# Patient Record
Sex: Male | Born: 1965
Health system: Southern US, Community
[De-identification: ages and names within clinical notes are randomized; demographics above are authoritative.]

---

## 2002-05-08 ENCOUNTER — Ambulatory Visit: Admission: RE | Admit: 2002-05-08 | Discharge: 2002-05-08 | Payer: Self-pay | Admitting: Neurosurgery

## 2002-05-22 ENCOUNTER — Encounter: Payer: Self-pay | Admitting: Neurosurgery

## 2002-05-22 ENCOUNTER — Ambulatory Visit (HOSPITAL_COMMUNITY): Admission: RE | Admit: 2002-05-22 | Discharge: 2002-05-22 | Payer: Self-pay | Admitting: Neurosurgery

## 2004-08-15 ENCOUNTER — Emergency Department: Payer: Self-pay | Admitting: Emergency Medicine

## 2008-01-14 ENCOUNTER — Ambulatory Visit: Payer: Self-pay | Admitting: Family Medicine

## 2008-02-25 ENCOUNTER — Observation Stay (HOSPITAL_COMMUNITY): Admission: RE | Admit: 2008-02-25 | Discharge: 2008-02-26 | Payer: Self-pay | Admitting: Neurosurgery

## 2008-04-06 ENCOUNTER — Encounter: Admission: RE | Admit: 2008-04-06 | Discharge: 2008-04-06 | Payer: Self-pay | Admitting: Neurosurgery

## 2008-05-20 ENCOUNTER — Encounter: Admission: RE | Admit: 2008-05-20 | Discharge: 2008-05-20 | Payer: Self-pay | Admitting: Neurosurgery

## 2010-04-10 ENCOUNTER — Encounter: Payer: Self-pay | Admitting: Psychiatry

## 2010-08-02 NOTE — Op Note (Signed)
Justin Hayes, Justin Hayes               ACCOUNT NO.:  [id number]   MEDICAL RECORD NO.:  [id number]          PATIENT TYPE:  INP   LOCATION:  2899                         FACILITY:  [facility]   PHYSICIAN:  Reinaldo Meeker, M.D. DATE OF BIRTH:  November 22, 1965   DATE OF PROCEDURE:  02/25/2008  DATE OF DISCHARGE:                               OPERATIVE REPORT   PREOPERATIVE DIAGNOSIS:  Herniated disk, L4-L5 right, recurrent.   POSTOPERATIVE DIAGNOSIS:  Herniated disk, L4-L5 right, recurrent.   PROCEDURE:  Right L4-L5 decompressive laminectomy followed by right L4-  L5 microdiskectomy, followed by right L4-L5 transforaminal lumbar  interbody fusion with PEEK interbody spacer followed by L4-L5  nonsegmental instrumentation with left-sided transfacet screw and a  spinous process plating followed by L4-L5 posterolateral fusion.   SURGEON:  Reinaldo Meeker, MD   ASSISTANT:  Kathaleen Maser. Pool, MD   PROCEDURE IN DETAIL:  After being placed in prone position, the  patient's back was prepped and draped in the usual sterile fashion.  Localizing fluoroscopy was used prior to incision to identify the  appropriate level.  A previous lumbar incision was opened up and  extended in both directions and carried down to the tips of the spinous  processes.  Subperiosteal dissection was then carried out bilaterally on  the spinous process, lamina, and facet joint bilaterally and self-  retaining retractor was placed for exposure.  X-rays showed approach to  the appropriate level.  On the patient's right side, edges of the  previous laminotomy were identified, dissected free of soft tissue with  a curette.  High-speed drill was then used to thin out the bone to  enlarge the laminotomy to the point where the inferior two-thirds of the  L4 lamina, the medial three-quarters of facet joint, the superior two-  thirds of the L5 lamina had been removed.  Residual bone was removed and  saved for use later in the case and scar  tissue and ligamentum flavum  were removed and discarded.  At this time, microsection technique was  carried out to identify the L4-L5 disk which was incised with a 15 blade  and thoroughly cleaned out with pituitary rongeurs and curettes.  A few  large herniated disk fragments were identified and removed giving  excellent decompression of the L5 nerve root.  At this time, inspection  was carried out in all directions for any evidence of residual  compression and none could be identified.  Irrigation was carried out  and the disk space was then prepared for transverse lumbar interbody  fusion.  Disk space was distracted up to 10-mm size, this was felt to be  a good size for the spacer.  A variety of rotating cutters and curettes  were used to prepare the endplates for interbody fusion.  A cage filled  with autologous bone and Osteocel Plus was filled and the same mixture  was placed deep within the disk space to help with the interbody fusion.  The cage was then impacted without difficulty and followed into good  position across the midline and approximately one-half to two-thirds  deep  in the disk space.  At this time, left-sided transfacet screw was  placed.  Drill was used for starting point and then drill over guidewire  was passed in an superior and inferior direction and medial to lateral.  This was followed under AP and lateral fluoroscopy in good position and  also tested with NeuroVision EMG monitoring which showed no evidence of  breech of the pedicle.  Tapping was then carried out followed by  placement of 35-mm screw which was felt to be in good position by  fluoroscopy.  An appropriate length spinous process plate was then  chosen.  After bit of the interspinous ligament had been removed, the  plate was compressed without difficulty and disengaged from its holder.  Final fluoroscopy in AP and lateral direction looked excellent.  Large  amounts of irrigation were carried out at  this time.  High-speed drill  was used to decorticate the lamina and facet joint at L4-L5 on the left  and autologous bone and Osteocel Plus were placed with posterolateral  fusion.  At this time, inspection was carried out for any evidence of  residual compression of bleeding and none could be identified.  Wound  was then closed in multiple layers of Vicryl in the muscle fascia,  subcutaneous, subcuticular tissues, and staples were placed on the skin.  Once again, the final fluoroscopy in AP and lateral direction looked  excellent.  The patient was then extubated and taken to the recovery  room in stable condition.           ______________________________  Reinaldo Meeker, M.D.     ROK/MEDQ  D:  02/25/2008  T:  02/26/2008  Job:  [id number]

## 2010-08-05 NOTE — Op Note (Signed)
NAME:  Justin Hayes, Justin Hayes                         ACCOUNT NO.:  [id number]   MEDICAL RECORD NO.:  [id number]                   PATIENT TYPE:  OIB   LOCATION:  3011                                 FACILITY:  [facility]   PHYSICIAN:  Reinaldo Meeker, M.D.              DATE OF BIRTH:  01-Mar-1966   DATE OF PROCEDURE:  05/22/2002  DATE OF DISCHARGE:  05/22/2002                                 OPERATIVE REPORT   PREOPERATIVE DIAGNOSIS:  Herniated disk, L4-5 right.   POSTOPERATIVE DIAGNOSIS:  Herniated disk, L4-5 right.   PROCEDURES:  1. Right L4-5 intralaminar laminotomy for excision of herniated disk with     the operating microscope.  2. Microdissection, L4-5 disk and L5 nerve root.   SURGEON:  Reinaldo Meeker, M.D.   ASSISTANT:  Donalee Citrin, M.D.   DESCRIPTION OF PROCEDURE:  After being placed in a prone position, the  patient's back was prepped and draped in the usual sterile fashion.  A  localizing x-ray was taken prior to incision to identify the L4-5 level.  A  midline incision made above the spinous processes of L4 and L5.  Using Bovie  cutting current, the incision was carried down to the spinous processes.  Subperiosteal dissection was then carried out along the right-sided spinous  processes and laminae and the McCullough self-retaining retractor was placed  for exposure.  A second x-ray showed approach to the appropriate level.  Using the high-speed drill, the inferior one-third of the L4 lamina and the  medial one-third of the facet joint were removed.  The drill was then used  to remove the superior one-third of the L5 lamina.  Residual bone and  ligamentum flavum were removed in a piecemeal fashion.  The microscope was  draped, brought into the field, and used for the remainder of the case.  Using microdissection technique, the lateral aspect of the thecal sac and L5  nerve were identified.  Further coagulation was carried down to the floor of  the canal to identify the L4-5  disk, which was found to be markedly  herniated.  After coagulating on the annulus, the annulus was incised with a  15 blade.  Using pituitary rongeurs and curettes, a thorough disk space  clean-out was carried out.  At the same time great care was taken to avoid  injury to the neural elements, and this was successfully done.  At this  point inspection was carried out in all directions for any evidence of  residual compression, and none could be identified.  Large amounts of  irrigation were carried out and any bleeding controlled with bipolar  coagulation and Gelfoam.  The wound was then closed using interrupted Vicryl  on the muscle, fascia, subcutaneous, and subcuticular tissues, and staples  on the skin.  A sterile dressing was then applied and the patient was  extubated and taken to the recovery room in stable condition.  Reinaldo Meeker, M.D.    ROK/MEDQ  D:  05/22/2002  T:  05/23/2002  Job:  [id number]

## 2010-12-22 LAB — CBC
HCT: 43.9 % (ref 39.0–52.0)
MCHC: 34.1 g/dL (ref 30.0–36.0)
MCV: 88.4 fL (ref 78.0–100.0)
Platelets: 259 10*3/uL (ref 150–400)
RDW: 12.8 % (ref 11.5–15.5)
WBC: 7.1 10*3/uL (ref 4.0–10.5)

## 2010-12-22 LAB — ABO/RH: ABO/RH(D): A POS

## 2012-03-01 ENCOUNTER — Ambulatory Visit: Payer: Self-pay | Admitting: Family Medicine

## 2013-07-01 ENCOUNTER — Ambulatory Visit: Payer: Self-pay | Admitting: Podiatry

## 2013-07-04 ENCOUNTER — Ambulatory Visit: Payer: Self-pay | Admitting: Podiatry

## 2014-07-11 NOTE — Op Note (Signed)
PATIENT NAME:  Justin Hayes, Justin Hayes MR#:  [id number] DATE OF BIRTH:  06-Sep-1965  DATE OF PROCEDURE:  07/04/2013  PREOPERATIVE DIAGNOSIS: Right fifth metatarsal base Jones fracture.   POSTOPERATIVE DIAGNOSIS: Right fifth metatarsal base Jones fracture.   PROCEDURE: Open reduction with internal fixation of right fifth metatarsal Jones fracture.   SURGEON: Romy Mcgue A. Vickki Muff, DPM  ANESTHESIA: General with popliteal block.   HEMOSTASIS: Mid calf tourniquet inflated to 250 mmHg for approximately 38 minutes.   COMPLICATIONS: None.  SPECIMEN: None.   ESTIMATED BLOOD LOSS: Minimal.   OPERATIVE INDICATIONS: This is a 49 year old gentleman who sustained a Jones fracture to his fifth metatarsal base. He initially underwent conservative treatment, refractured through the area and presents today for surgical intervention. All risks, benefits, alternatives, and complications associated with the surgery were discussed with the patient and full informed consent has been given.   OPERATIVE PROCEDURE: The patient was brought into the OR and placed on the operating table in the supine position. A popliteal block had been placed prior to entering the OR suite. LMA anesthesia was then obtained. The right lower extremity was then prepped and draped in the usual sterile fashion. Attention was directed to the lateral aspect of the fifth metatarsal base where the fifth metatarsal was mapped out. A longitudinal incision was made in line with the peroneal tendons and sural nerve region. Initial incision was begun. This was taken down to the subcutaneous tissue. The peroneal tendon as well as the sural nerve were noted and retracted throughout the entire procedure. At this time, the base of the fifth metatarsal was then palpated with a blunt probe. Next, a guidewire for the 5.5 mm cannulated Ortho Helix screws were placed through the fifth metatarsal base into the shaft of the fifth metatarsal. Good alignment was noted in all 3  planes with fluoroscopy. Next, using standard technique, the 5.5 mm x 50 mm screw was placed. Initially, this was drilled across the fracture, but not completely to the full length. It was countersunk appropriately and the screw was then placed by hand. Good stability compression was noted at the fracture site with fluoroscopy. There was no gapping of the fracture at all. At this time, the wound was flushed with copious amounts of irrigation. Multiple fluoroscopy films were taken. A well compressive sterile dressing was placed after 4-0 Vicryl and 5-0 Monocryl for the subcutaneous layers and skin. A 0.25% Marcaine block was placed around the incision site and was placed in a posterior splint. He will be taken from the OR to the PACU. Prior to final movement from the OR to the PAC-U, a Physio-Stim bone stimulator was placed on the right lower leg overlying the fracture site. The patient will be discharged home with p.o. Percocet for pain as well as he already has crutches for nonweightbearing. I will see him in the outpatient clinic in 5 to 7 days. He understands to keep his foot elevated and call me if there are any questions in the interim.   ____________________________ Pete Glatter Vickki Muff, DPM jaf:sb D: 07/04/2013 08:52:15 ET T: 07/04/2013 10:26:51 ET JOB#: [id number]  cc: Larkin Ina A. Vickki Muff, DPM, <Dictator> Lat[place]ia [place] DPM ELECTRONICALLY SIGNED 07/11/2013 14:18

## 2016-05-16 DIAGNOSIS — Z0000 Encounter for general adult medical examination without abnormal findings: Secondary | ICD-10-CM | POA: Diagnosis not present

## 2016-09-11 DIAGNOSIS — L821 Other seborrheic keratosis: Secondary | ICD-10-CM | POA: Diagnosis not present

## 2016-09-25 DIAGNOSIS — M9901 Segmental and somatic dysfunction of cervical region: Secondary | ICD-10-CM | POA: Diagnosis not present

## 2016-09-25 DIAGNOSIS — M542 Cervicalgia: Secondary | ICD-10-CM | POA: Diagnosis not present

## 2016-09-25 DIAGNOSIS — M9902 Segmental and somatic dysfunction of thoracic region: Secondary | ICD-10-CM | POA: Diagnosis not present

## 2016-09-29 DIAGNOSIS — M9902 Segmental and somatic dysfunction of thoracic region: Secondary | ICD-10-CM | POA: Diagnosis not present

## 2016-09-29 DIAGNOSIS — M542 Cervicalgia: Secondary | ICD-10-CM | POA: Diagnosis not present

## 2016-09-29 DIAGNOSIS — M9901 Segmental and somatic dysfunction of cervical region: Secondary | ICD-10-CM | POA: Diagnosis not present

## 2016-10-02 DIAGNOSIS — M9901 Segmental and somatic dysfunction of cervical region: Secondary | ICD-10-CM | POA: Diagnosis not present

## 2016-10-02 DIAGNOSIS — M9902 Segmental and somatic dysfunction of thoracic region: Secondary | ICD-10-CM | POA: Diagnosis not present

## 2016-10-02 DIAGNOSIS — M542 Cervicalgia: Secondary | ICD-10-CM | POA: Diagnosis not present

## 2016-10-31 DIAGNOSIS — M5416 Radiculopathy, lumbar region: Secondary | ICD-10-CM | POA: Diagnosis not present

## 2016-10-31 DIAGNOSIS — M5412 Radiculopathy, cervical region: Secondary | ICD-10-CM | POA: Diagnosis not present

## 2016-11-14 DIAGNOSIS — G43709 Chronic migraine without aura, not intractable, without status migrainosus: Secondary | ICD-10-CM | POA: Diagnosis not present

## 2016-11-14 DIAGNOSIS — M5010 Cervical disc disorder with radiculopathy, unspecified cervical region: Secondary | ICD-10-CM | POA: Diagnosis not present

## 2016-11-15 ENCOUNTER — Other Ambulatory Visit: Payer: Self-pay | Admitting: Internal Medicine

## 2016-11-15 DIAGNOSIS — M5010 Cervical disc disorder with radiculopathy, unspecified cervical region: Secondary | ICD-10-CM

## 2016-11-27 ENCOUNTER — Ambulatory Visit
Admission: RE | Admit: 2016-11-27 | Discharge: 2016-11-27 | Disposition: A | Discharge status: Home / Self Care | Payer: Commercial Managed Care - HMO | Source: Ambulatory Visit | Attending: Internal Medicine | Admitting: Internal Medicine

## 2016-11-27 DIAGNOSIS — M5010 Cervical disc disorder with radiculopathy, unspecified cervical region: Secondary | ICD-10-CM | POA: Diagnosis not present

## 2016-11-27 DIAGNOSIS — M50323 Other cervical disc degeneration at C6-C7 level: Secondary | ICD-10-CM | POA: Diagnosis not present

## 2016-11-27 DIAGNOSIS — M50223 Other cervical disc displacement at C6-C7 level: Secondary | ICD-10-CM | POA: Diagnosis not present

## 2016-12-11 DIAGNOSIS — M5412 Radiculopathy, cervical region: Secondary | ICD-10-CM | POA: Diagnosis not present

## 2016-12-18 DIAGNOSIS — Z01818 Encounter for other preprocedural examination: Secondary | ICD-10-CM | POA: Diagnosis not present

## 2016-12-20 DIAGNOSIS — M50223 Other cervical disc displacement at C6-C7 level: Secondary | ICD-10-CM | POA: Diagnosis not present

## 2016-12-20 DIAGNOSIS — M4802 Spinal stenosis, cervical region: Secondary | ICD-10-CM | POA: Diagnosis not present

## 2016-12-20 DIAGNOSIS — M50222 Other cervical disc displacement at C5-C6 level: Secondary | ICD-10-CM | POA: Diagnosis not present

## 2016-12-20 DIAGNOSIS — M50122 Cervical disc disorder at C5-C6 level with radiculopathy: Secondary | ICD-10-CM | POA: Diagnosis not present

## 2016-12-20 DIAGNOSIS — M4722 Other spondylosis with radiculopathy, cervical region: Secondary | ICD-10-CM | POA: Diagnosis not present

## 2017-01-23 DIAGNOSIS — M542 Cervicalgia: Secondary | ICD-10-CM | POA: Diagnosis not present

## 2017-02-20 DIAGNOSIS — M542 Cervicalgia: Secondary | ICD-10-CM | POA: Diagnosis not present

## 2017-04-24 DIAGNOSIS — M542 Cervicalgia: Secondary | ICD-10-CM | POA: Diagnosis not present

## 2017-05-15 DIAGNOSIS — Z0000 Encounter for general adult medical examination without abnormal findings: Secondary | ICD-10-CM | POA: Diagnosis not present

## 2017-05-22 DIAGNOSIS — Z0000 Encounter for general adult medical examination without abnormal findings: Secondary | ICD-10-CM | POA: Diagnosis not present

## 2017-06-19 DIAGNOSIS — M542 Cervicalgia: Secondary | ICD-10-CM | POA: Diagnosis not present

## 2017-08-20 DIAGNOSIS — M542 Cervicalgia: Secondary | ICD-10-CM | POA: Diagnosis not present

## 2017-09-10 DIAGNOSIS — D225 Melanocytic nevi of trunk: Secondary | ICD-10-CM | POA: Diagnosis not present

## 2017-09-10 DIAGNOSIS — D2262 Melanocytic nevi of left upper limb, including shoulder: Secondary | ICD-10-CM | POA: Diagnosis not present

## 2017-09-10 DIAGNOSIS — D2261 Melanocytic nevi of right upper limb, including shoulder: Secondary | ICD-10-CM | POA: Diagnosis not present

## 2017-09-25 DIAGNOSIS — M542 Cervicalgia: Secondary | ICD-10-CM | POA: Diagnosis not present

## 2017-09-25 DIAGNOSIS — R030 Elevated blood-pressure reading, without diagnosis of hypertension: Secondary | ICD-10-CM | POA: Diagnosis not present

## 2017-11-27 DIAGNOSIS — M5136 Other intervertebral disc degeneration, lumbar region: Secondary | ICD-10-CM | POA: Diagnosis not present

## 2017-11-27 DIAGNOSIS — M7062 Trochanteric bursitis, left hip: Secondary | ICD-10-CM | POA: Diagnosis not present

## 2019-06-07 IMAGING — MR MR CERVICAL SPINE W/O CM
5 series · 33 of 48 positions shown · non-contrast
Comparison: None.

CLINICAL DATA: Left arm and shoulder pain. Numbness and tingling in
the fingers of the left hand.

EXAM:
MRI CERVICAL SPINE WITHOUT CONTRAST
TECHNIQUE: Multiplanar, multisequence MR imaging of the cervical spine was
performed. No intravenous contrast was administered.

[Series 3: T2 · sagittal · 3.0mm · 0.70mm/px · 6 of 15 slices shown (1 of 2)]
[im 1/15]
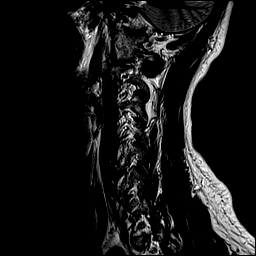
[im 3/15]
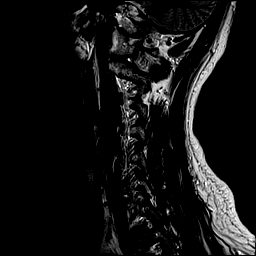
[im 6/15]
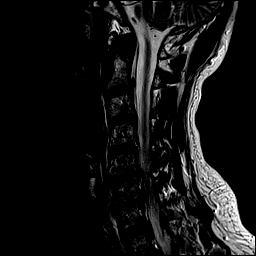
[im 9/15]
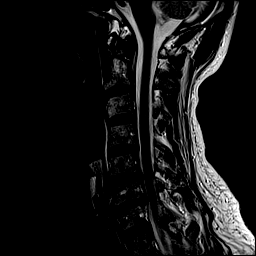
[im 12/15]
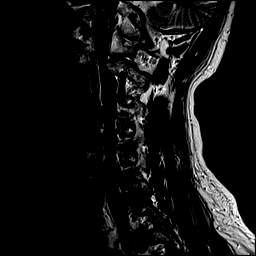
[im 15/15]
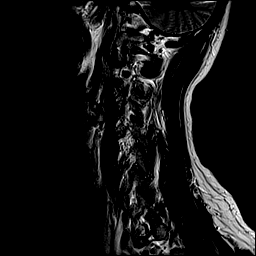

[Series 4: T1 · sagittal · 3.0mm · 0.70mm/px · 7 of 15 slices shown]
[im 1/15]
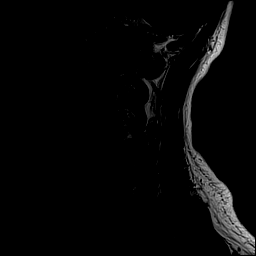
[im 3/15]
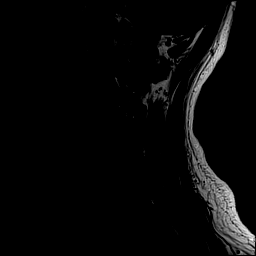
[im 5/15]
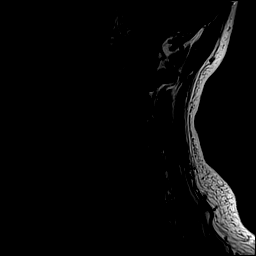
[im 8/15]
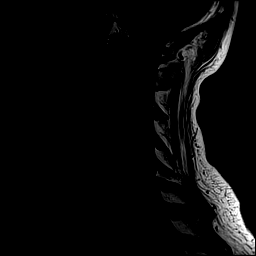
[im 10/15]
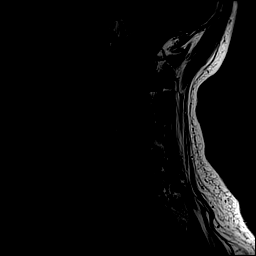
[im 12/15]
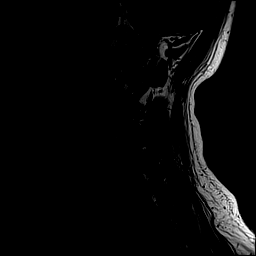
[im 15/15]
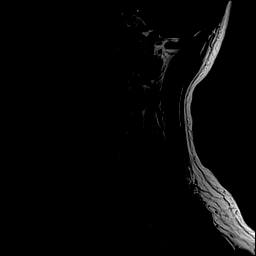

[Series 5: STIR · sagittal · 3.0mm · 0.35mm/px · 7 of 15 slices shown]
[im 1/15]
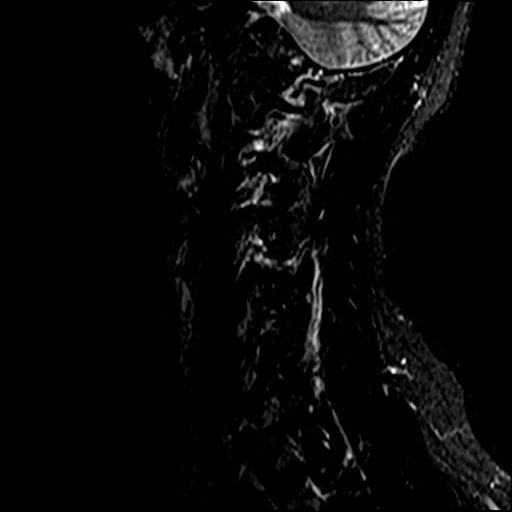
[im 3/15]
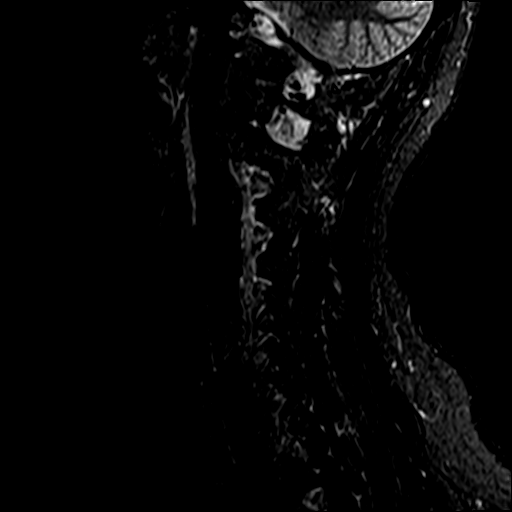
[im 5/15]
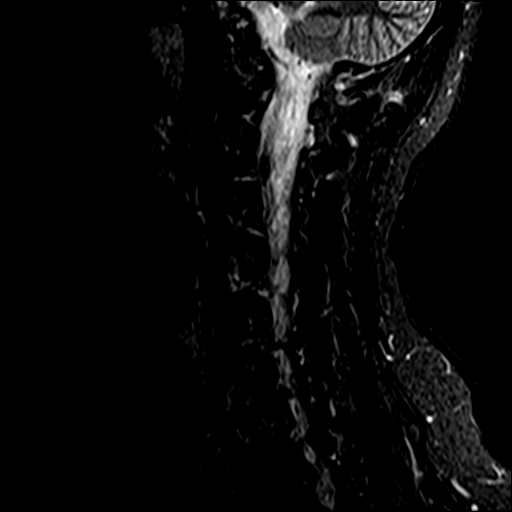
[im 8/15]
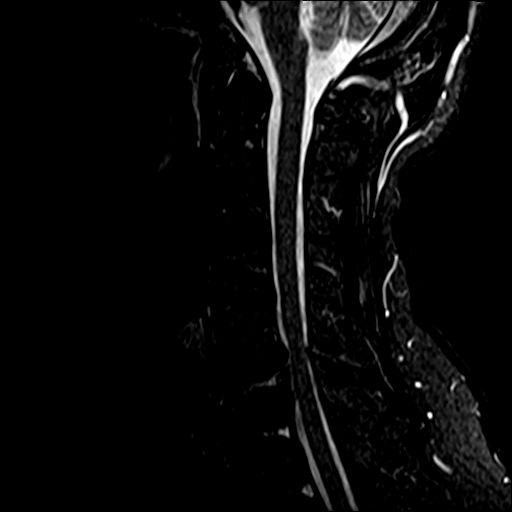
[im 10/15]
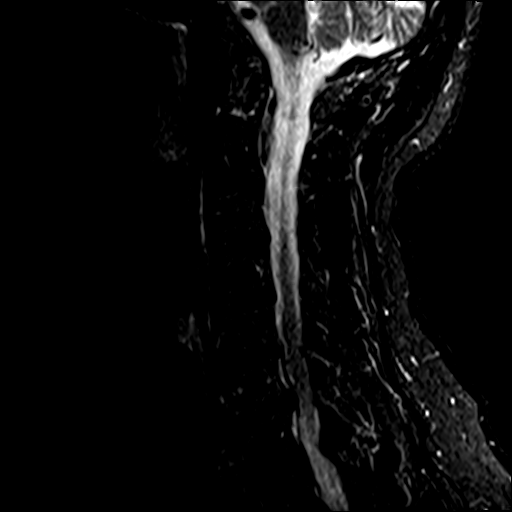
[im 12/15]
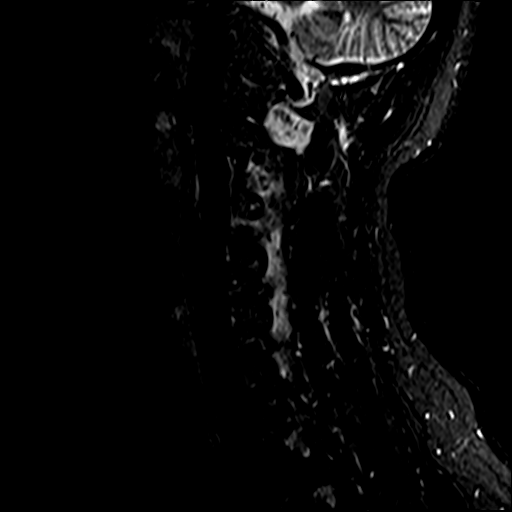
[im 15/15]
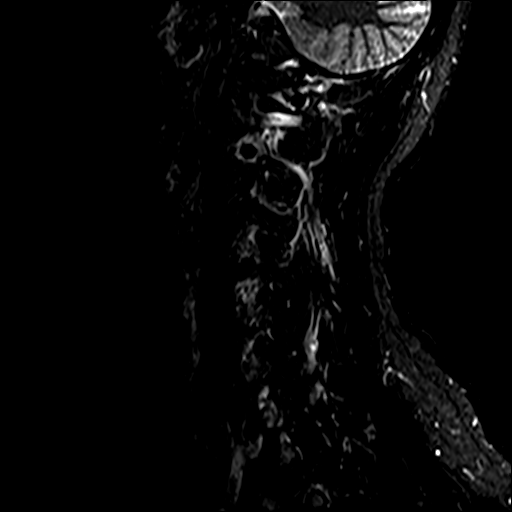

[Series 6: T2 · axial · 3.0mm · 0.70mm/px · z∈[-39,+78]mm · 8 of 31 slices shown (2 of 2)]
[im 1/31]
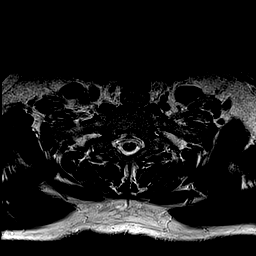
[im 5/31]
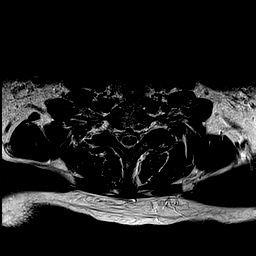
[im 10/31]
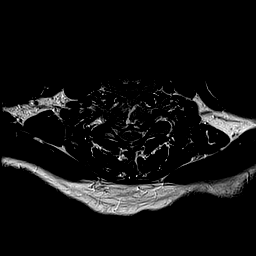
[im 14/31]
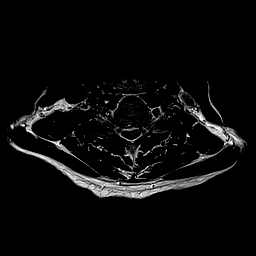
[im 17/31]
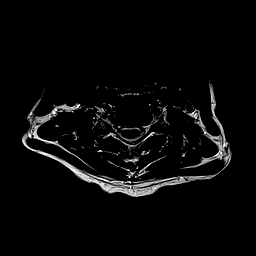
[im 21/31]
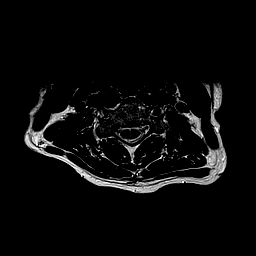
[im 26/31]
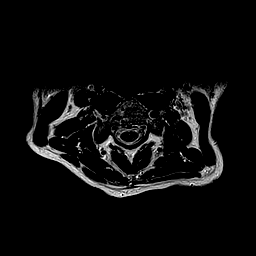
[im 31/31]
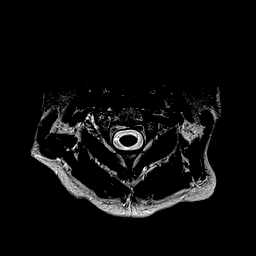

[Series 7: mpgr ax · axial · 3.0mm · 0.35mm/px · z∈[-39,+23]mm · 5 of 31 slices shown]
[im 1/31]
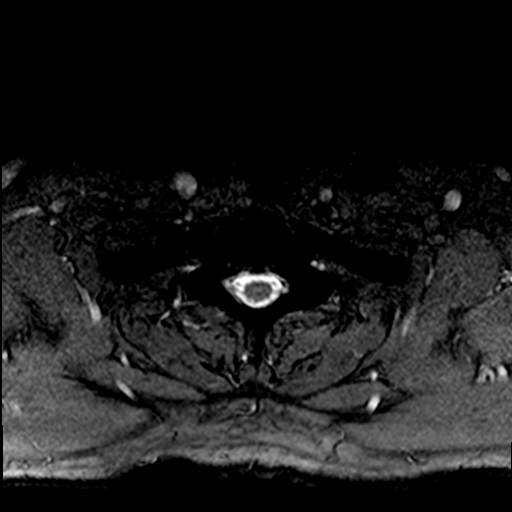
[im 5/31]
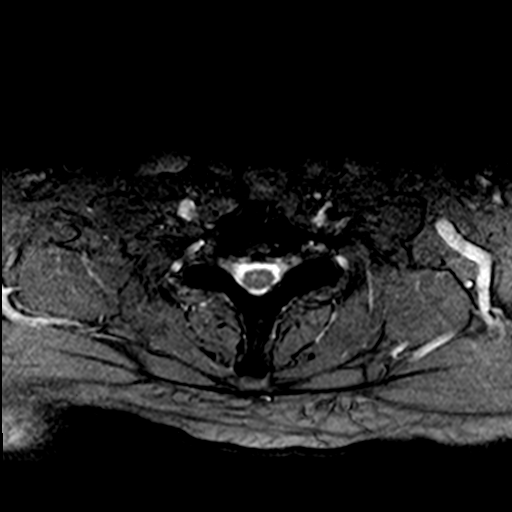
[im 10/31]
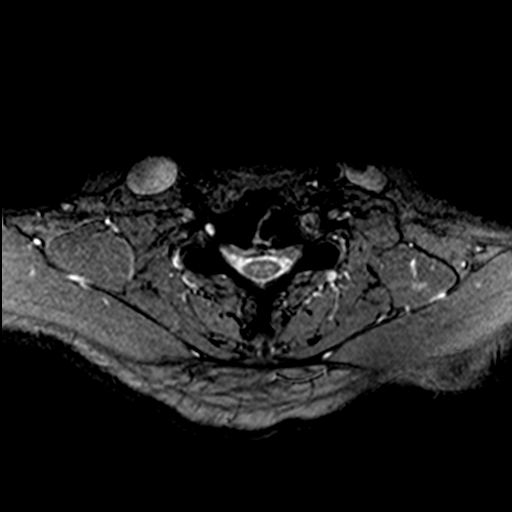
[im 14/31]
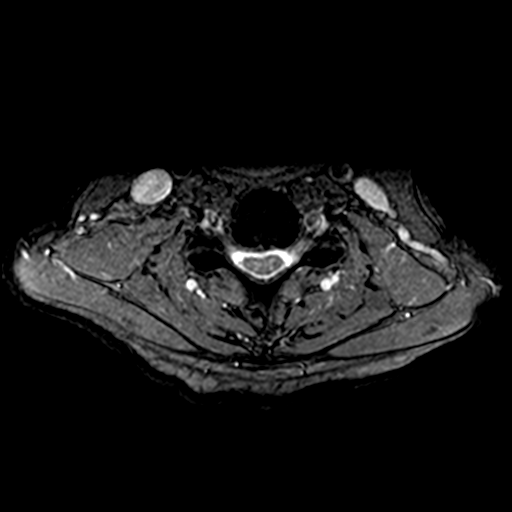
[im 17/31]
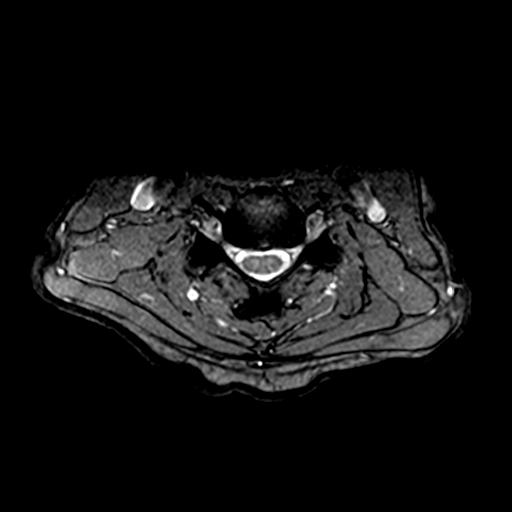

[33 of 48 positions shown; findings below may reference images not displayed]

FINDINGS: Alignment: Physiologic.

Vertebrae: No fracture, evidence of discitis, or bone lesion.

Cord: Normal signal. The spinal cord is slightly compressed at C5-6
without myelopathy.

Posterior Fossa, vertebral arteries, paraspinal tissues: Negative.

Disc levels:

Craniocervical junction through C3-4:  Normal.

C4-5: Slight degenerative disc disease with an annular fissure to
the right of midline and minimal uncinate spurring to the left
without neural impingement.

C5-6: Soft tissue protrusion central and to the right slightly
compressing the ventral aspect of the right side of the spinal cord
without myelopathy. Uncinate spurring to the right and left creating
slight narrowing of the neural foramina.

C6-7: Prominent soft disc protrusion into the left lateral recess
and proximal left neural foramen which should affect the left C7
nerve. There is also a soft disc protrusion to the right of midline
with accompanying osteophytes which could affect the right C7 nerve.
Spinal cord is not compressed.

C7-T1: Negative.

No significant facet arthritis in the cervical spine.
IMPRESSION: 1. Soft disc protrusion to the right and left at C6-7 but more
prominent to the left, compressing the left C7 nerve.
2. Soft disc protrusion at C5-6 central and to the right with
bilateral foraminal narrowing.

## 2022-01-23 ENCOUNTER — Other Ambulatory Visit: Payer: Self-pay | Admitting: Internal Medicine

## 2022-01-23 DIAGNOSIS — M5010 Cervical disc disorder with radiculopathy, unspecified cervical region: Secondary | ICD-10-CM

## 2022-02-06 ENCOUNTER — Ambulatory Visit
Admission: RE | Admit: 2022-02-06 | Discharge: 2022-02-06 | Disposition: A | Discharge status: Home / Self Care | Payer: BC Managed Care – PPO | Source: Ambulatory Visit | Attending: Internal Medicine | Admitting: Internal Medicine

## 2022-02-06 DIAGNOSIS — M5010 Cervical disc disorder with radiculopathy, unspecified cervical region: Secondary | ICD-10-CM

## 2022-02-13 ENCOUNTER — Ambulatory Visit
Admission: RE | Admit: 2022-02-13 | Discharge: 2022-02-13 | Disposition: A | Discharge status: Home / Self Care | Payer: BC Managed Care – PPO | Source: Ambulatory Visit | Attending: Internal Medicine | Admitting: Internal Medicine
